# Patient Record
Sex: Female | Born: 1995 | Race: Black or African American | Hispanic: No | Marital: Single | Smoking: Never smoker
Health system: Southern US, Community
[De-identification: ages and names within clinical notes are randomized; demographics above are authoritative.]

## PROBLEM LIST (undated history)

## (undated) DIAGNOSIS — F99 Mental disorder, not otherwise specified: Secondary | ICD-10-CM

## (undated) DIAGNOSIS — J45909 Unspecified asthma, uncomplicated: Secondary | ICD-10-CM

## (undated) HISTORY — PX: TONSILLECTOMY: SUR1361

## (undated) HISTORY — PX: CERVICAL CERCLAGE: SHX1329

## (undated) HISTORY — DX: Mental disorder, not otherwise specified: F99

---

## 2020-02-22 ENCOUNTER — Encounter (HOSPITAL_COMMUNITY): Payer: Self-pay

## 2020-02-22 ENCOUNTER — Other Ambulatory Visit: Payer: Self-pay

## 2020-02-22 ENCOUNTER — Emergency Department (HOSPITAL_COMMUNITY)
Admission: EM | Admit: 2020-02-22 | Discharge: 2020-02-23 | Disposition: A | Discharge status: Home / Self Care | Payer: Medicaid - Out of State | Attending: Emergency Medicine | Admitting: Emergency Medicine

## 2020-02-22 DIAGNOSIS — N939 Abnormal uterine and vaginal bleeding, unspecified: Secondary | ICD-10-CM | POA: Diagnosis not present

## 2020-02-22 DIAGNOSIS — J45909 Unspecified asthma, uncomplicated: Secondary | ICD-10-CM | POA: Diagnosis not present

## 2020-02-22 DIAGNOSIS — R1084 Generalized abdominal pain: Secondary | ICD-10-CM

## 2020-02-22 DIAGNOSIS — N938 Other specified abnormal uterine and vaginal bleeding: Secondary | ICD-10-CM

## 2020-02-22 HISTORY — DX: Unspecified asthma, uncomplicated: J45.909

## 2020-02-22 LAB — URINALYSIS, ROUTINE W REFLEX MICROSCOPIC
Bilirubin Urine: NEGATIVE
Glucose, UA: NEGATIVE mg/dL
Hgb urine dipstick: NEGATIVE
Ketones, ur: NEGATIVE mg/dL
Leukocytes,Ua: NEGATIVE
Nitrite: NEGATIVE
Protein, ur: NEGATIVE mg/dL
Specific Gravity, Urine: 1.028 (ref 1.005–1.030)
pH: 6 (ref 5.0–8.0)

## 2020-02-22 LAB — LIPASE, BLOOD: Lipase: 25 U/L (ref 11–51)

## 2020-02-22 LAB — COMPREHENSIVE METABOLIC PANEL
ALT: 24 U/L (ref 0–44)
AST: 21 U/L (ref 15–41)
Albumin: 4.2 g/dL (ref 3.5–5.0)
Alkaline Phosphatase: 52 U/L (ref 38–126)
Anion gap: 7 (ref 5–15)
BUN: 15 mg/dL (ref 6–20)
CO2: 26 mmol/L (ref 22–32)
Calcium: 9.5 mg/dL (ref 8.9–10.3)
Chloride: 103 mmol/L (ref 98–111)
Creatinine, Ser: 0.96 mg/dL (ref 0.44–1.00)
GFR calc non Af Amer: >60 mL/min (ref >60)
Glucose, Bld: 96 mg/dL (ref 70–99)
Potassium: 3.9 mmol/L (ref 3.5–5.1)
Sodium: 136 mmol/L (ref 135–145)
Total Bilirubin: 0.3 mg/dL (ref 0.3–1.2)
Total Protein: 7.5 g/dL (ref 6.5–8.1)

## 2020-02-22 LAB — CBC
HCT: 39.7 % (ref 36.0–46.0)
Hemoglobin: 13.2 g/dL (ref 12.0–15.0)
MCH: 29.5 pg (ref 26.0–34.0)
MCHC: 33.2 g/dL (ref 30.0–36.0)
MCV: 88.8 fL (ref 80.0–100.0)
Platelets: 234 10*3/uL (ref 150–400)
RBC: 4.47 MIL/uL (ref 3.87–5.11)
RDW: 13.8 % (ref 11.5–15.5)
WBC: 10.7 10*3/uL — ABNORMAL HIGH (ref 4.0–10.5)
nRBC: 0 % (ref 0.0–0.2)

## 2020-02-22 LAB — POC URINE PREG, ED: Preg Test, Ur: NEGATIVE

## 2020-02-22 NOTE — ED Triage Notes (Signed)
Pt to er, pt states that she is here for some pelvic pain and bleeding, states that she hasn't had her menstrual cycle since august, states that she has taken several pregnancy tests, states that some have come back positive and others have come back negative.  Pt states that last month she was dx with two ovarian cysts, pt states that she is here tonight for abd pain and cramping and also som bleeding, states that she has been bleeding off an on for the past month.  States that sometimes it is like a period, other times it is very light.  Pt states that today her bleeding is very light.

## 2020-02-23 ENCOUNTER — Emergency Department (HOSPITAL_COMMUNITY): Payer: Medicaid - Out of State

## 2020-02-23 LAB — WET PREP, GENITAL
Clue Cells Wet Prep HPF POC: NONE SEEN
Sperm: NONE SEEN
Trich, Wet Prep: NONE SEEN
Yeast Wet Prep HPF POC: NONE SEEN

## 2020-02-23 MED ORDER — IBUPROFEN 600 MG PO TABS: 600.0000 mg | ORAL_TABLET | Freq: Four times a day (QID) | ORAL | 0 refills | Status: AC | PRN

## 2020-02-23 MED ORDER — KETOROLAC TROMETHAMINE 30 MG/ML IJ SOLN
30.0000 mg | Freq: Once | INTRAMUSCULAR | Status: AC
  Administered 2020-02-23: 30 mg via INTRAVENOUS
  Filled 2020-02-23: qty 1

## 2020-02-23 MED ORDER — IOHEXOL 300 MG/ML  SOLN
100.0000 mL | Freq: Once | INTRAMUSCULAR | Status: AC | PRN
  Administered 2020-02-23: 100 mL via INTRAVENOUS

## 2020-02-23 MED ORDER — SODIUM CHLORIDE 0.9 % IV BOLUS
1000.0000 mL | Freq: Once | INTRAVENOUS | Status: AC
  Administered 2020-02-23: 1000 mL via INTRAVENOUS

## 2020-02-23 NOTE — ED Provider Notes (Signed)
[facility] EMERGENCY DEPARTMENT Provider Note   CSN: [id number] Arrival date & time: 02/22/20  1927     History Chief Complaint  Patient presents with  . Abdominal Pain    Mercia Dowe is a 24 y.o. female.  Pt presents to the ED today with lower abdominal pain.  She also has not had a period since August.  She has taken 10 pregnancy tests.  The one today was positive, so she came in.  She does have a hx of ovarian cysts and is concerned one has ruptured.  She has an obgyn in [place], but can't get an appointment until January.  No f/c.          Past Medical History:  Diagnosis Date  . Asthma     There are no problems to display for this patient.   Past Surgical History:  Procedure Laterality Date  . TONSILLECTOMY       OB History   No obstetric history on file.     History reviewed. No pertinent family history.  Social History   Tobacco Use  . Smoking status: Never Smoker  . Smokeless tobacco: Never Used  Vaping Use  . Vaping Use: Never used  Substance Use Topics  . Alcohol use: Not Currently  . Drug use: Not Currently    Home Medications Prior to Admission medications   Medication Sig Start Date End Date Taking? Authorizing Provider  ibuprofen (ADVIL) 600 MG tablet Take 1 tablet (600 mg total) by mouth every 6 (six) hours as needed. 02/23/20   Jacalyn Lefevre, MD    Allergies    Patient has no allergy information on record.  Review of Systems   Review of Systems  Gastrointestinal: Positive for abdominal pain.  All other systems reviewed and are negative.   Physical Exam Updated Vital Signs BP 127/87 (BP Location: Right Arm)   Pulse 95   Temp 98.7 F (37.1 C) (Oral)   Resp 18   Ht 5\' 2"  (1.575 m)   Wt 74.8 kg   SpO2 100%   BMI 30.18 kg/m   Physical Exam Vitals and nursing note reviewed. Exam conducted with a chaperone present.  Constitutional:      Appearance: She is well-developed.  HENT:     Head: Normocephalic and  atraumatic.     Mouth/Throat:     Mouth: Mucous membranes are moist.     Pharynx: Oropharynx is clear.  Eyes:     Extraocular Movements: Extraocular movements intact.     Pupils: Pupils are equal, round, and reactive to light.  Cardiovascular:     Rate and Rhythm: Normal rate and regular rhythm.     Heart sounds: Normal heart sounds.  Pulmonary:     Effort: Pulmonary effort is normal.     Breath sounds: Normal breath sounds.  Abdominal:     General: Abdomen is flat. Bowel sounds are normal.     Palpations: Abdomen is soft.     Tenderness: There is abdominal tenderness in the right lower quadrant and left lower quadrant.  Genitourinary:    General: Normal vulva.     Exam position: Lithotomy position.     Vagina: Normal.     Cervix: Normal.     Uterus: Normal.      Adnexa:        Right: Tenderness present.        Left: Tenderness present.   Skin:    General: Skin is warm.     Capillary Refill: Capillary  refill takes less than 2 seconds.  Neurological:     General: No focal deficit present.     Mental Status: She is alert and oriented to person, place, and time.  Psychiatric:        Mood and Affect: Mood normal.        Behavior: Behavior normal.     ED Results / Procedures / Treatments   Labs (all labs ordered are listed, but only abnormal results are displayed) Labs Reviewed  WET PREP, GENITAL - Abnormal; Notable for the following components:      Result Value   WBC, Wet Prep HPF POC MODERATE (*)    All other components within normal limits  CBC - Abnormal; Notable for the following components:   WBC 10.7 (*)    All other components within normal limits  URINALYSIS, ROUTINE W REFLEX MICROSCOPIC - Abnormal; Notable for the following components:   APPearance HAZY (*)    All other components within normal limits  LIPASE, BLOOD  COMPREHENSIVE METABOLIC PANEL  POC URINE PREG, ED  GC/CHLAMYDIA PROBE AMP ([place]) NOT AT [facility]    EKG None  Radiology CT ABDOMEN  PELVIS W CONTRAST  Result Date: 02/23/2020 CLINICAL DATA:  Abdominal pain acute nonlocalized EXAM: CT ABDOMEN AND PELVIS WITH CONTRAST TECHNIQUE: Multidetector CT imaging of the abdomen and pelvis was performed using the standard protocol following bolus administration of intravenous contrast. CONTRAST:  OMNIPAQUE IOHEXOL 300 MG/ML  SOLN COMPARISON:  None. FINDINGS: Lower chest: The visualized heart size within normal limits. No pericardial fluid/thickening. No hiatal hernia. The visualized portions of the lungs are clear. Hepatobiliary: The liver is normal in density without focal abnormality.The main portal vein is patent. No evidence of calcified gallstones, gallbladder wall thickening or biliary dilatation. Pancreas: Unremarkable. No pancreatic ductal dilatation or surrounding inflammatory changes. Spleen: Normal in size without focal abnormality. Adrenals/Urinary Tract: Both adrenal glands appear normal. The kidneys and collecting system appear normal without evidence of urinary tract calculus or hydronephrosis. Bladder is unremarkable. Stomach/Bowel: The stomach, small bowel, and colon are normal in appearance. No inflammatory changes, wall thickening, or obstructive findings.The appendix is normal. Vascular/Lymphatic: There are no enlarged mesenteric, retroperitoneal, or pelvic lymph nodes. No significant vascular findings are present. Reproductive: The uterus and adnexa are unremarkable. A small amount of free fluid is seen within the cul-de-sac. Other: No evidence of abdominal wall mass or hernia. Musculoskeletal: No acute or significant osseous findings. IMPRESSION: No acute intra-abdominal or pelvic pathology to explain the patient's symptoms. Electronically Signed   By: Jonna Clark M.D.   On: 02/23/2020 01:02    Procedures Procedures (including critical care time)  Medications Ordered in ED Medications  ketorolac (TORADOL) 30 MG/ML injection 30 mg (30 mg Intravenous Given 02/23/20 0034)   sodium chloride 0.9 % bolus 1,000 mL (1,000 mLs Intravenous New Bag/Given 02/23/20 0034)  iohexol (OMNIPAQUE) 300 MG/ML solution 100 mL (100 mLs Intravenous Contrast Given 02/23/20 0044)    ED Course  I have reviewed the triage vital signs and the nursing notes.  Pertinent labs & imaging results that were available during my care of the patient were reviewed by me and considered in my medical decision making (see chart for details).    MDM Rules/Calculators/A&P                          Labs unremarkable.  CT nothing acute.  Pt is feeling better.  She is instructed to f/u with  gyn.  Return if worse.  Final Clinical Impression(s) / ED Diagnoses Final diagnoses:  Generalized abdominal pain  DUB (dysfunctional uterine bleeding)    Rx / DC Orders ED Discharge Orders         Ordered    ibuprofen (ADVIL) 600 MG tablet  Every 6 hours PRN        02/23/20 0111           Jacalyn Lefevre, MD 02/23/20 0112

## 2020-02-24 LAB — GC/CHLAMYDIA PROBE AMP (~~LOC~~) NOT AT ARMC
Chlamydia: NEGATIVE
Comment: NEGATIVE
Comment: NORMAL
Neisseria Gonorrhea: NEGATIVE

## 2020-02-28 ENCOUNTER — Encounter: Payer: Self-pay | Admitting: Adult Health

## 2020-02-28 ENCOUNTER — Ambulatory Visit (INDEPENDENT_AMBULATORY_CARE_PROVIDER_SITE_OTHER): Payer: Medicaid - Out of State | Admitting: Adult Health

## 2020-02-28 VITALS — BP 111/79 | HR 92 | Ht 62.0 in | Wt 174.0 lb

## 2020-02-28 DIAGNOSIS — N941 Unspecified dyspareunia: Secondary | ICD-10-CM | POA: Diagnosis not present

## 2020-02-28 DIAGNOSIS — N926 Irregular menstruation, unspecified: Secondary | ICD-10-CM | POA: Insufficient documentation

## 2020-02-28 DIAGNOSIS — R102 Pelvic and perineal pain: Secondary | ICD-10-CM | POA: Insufficient documentation

## 2020-02-28 DIAGNOSIS — Z8742 Personal history of other diseases of the female genital tract: Secondary | ICD-10-CM | POA: Diagnosis not present

## 2020-02-28 LAB — POCT URINE PREGNANCY: Preg Test, Ur: NEGATIVE

## 2020-02-28 NOTE — Progress Notes (Signed)
  Subjective:     Patient ID: Jasmine Bernard, female   DOB: [birth year], 24 y.o.   MRN: [id number]  HPI Jasmine Bernard is a 24 year old black female, single, G1P0101, in for follow up of ER visit 02/23/20 at [facility], had pelvic pain, bleeding on and off. And LMP in August. She says was seen in [place] in May and told had cyst on ovary, but CT done 02/23/20 did not show any. GC/CHL was negative in ER and WBC was 10.7 PCP is Tresa Res FNP  Review of Systems +pelvic pain +pain with sex She denies any problems with BMs or urination  Reviewed past medical,surgical, social and family history. Reviewed medications and allergies.     Objective:   Physical Exam BP 111/79 (BP Location: Left Arm, Patient Position: Sitting, Cuff Size: Normal)   Pulse 92   Ht 5\' 2"  (1.575 m)   Wt 174 lb (78.9 kg)   LMP 01/05/2020 Comment: pt states that recent bleeding wasn't a cycle  BMI 31.83 kg/m UPT is negative  Skin warm and dry.Pelvic: external genitalia is normal in appearance no lesions, vagina: white discharge, no odor,urethra has no lesions or masses noted, cervix:smooth and bulbous, No CMT,uterus: normal size, shape and contour, mildly tender, no masses felt, adnexa: no masses, LLQ tenderness noted. Bladder is non tender and no masses felt. AA is 4  Fall risk is low PHQ 9 score is 14, she declines meds, no SI  Upstream - 02/28/20 1036      Pregnancy Intention Screening   Does the patient want to become pregnant in the next year? No    Does the patient's partner want to become pregnant in the next year? No    Would the patient like to discuss contraceptive options today? No      Contraception Wrap Up   Current Method No Method - Other Reason    End Method No Method - Other Reason    Contraception Counseling Provided No         Examination chaperoned by 04/29/20 LPN    Assessment:     1. Missed periods  2. Pelvic pain Will get [name] 03/06/20 at [facility] at 12:30 pm be there at 12:15 pm  with a full bladder  3. History of ovarian cyst Will get [facility]YN [facility]  4. Dyspareunia in female     Plan:     Will talk when [place] back, may try OCs to cycle and suppress ovaries

## 2020-03-06 ENCOUNTER — Ambulatory Visit (HOSPITAL_COMMUNITY)
Admission: RE | Admit: 2020-03-06 | Discharge: 2020-03-06 | Disposition: A | Discharge status: Home / Self Care | Payer: Medicaid - Out of State | Source: Ambulatory Visit | Attending: Adult Health | Admitting: Adult Health

## 2020-03-06 ENCOUNTER — Other Ambulatory Visit: Payer: Self-pay

## 2020-03-06 ENCOUNTER — Encounter (HOSPITAL_COMMUNITY): Payer: Self-pay

## 2020-03-06 ENCOUNTER — Ambulatory Visit (HOSPITAL_COMMUNITY): Payer: Medicaid - Out of State

## 2020-03-06 DIAGNOSIS — R102 Pelvic and perineal pain: Secondary | ICD-10-CM

## 2020-03-06 DIAGNOSIS — Z8742 Personal history of other diseases of the female genital tract: Secondary | ICD-10-CM

## 2020-10-16 ENCOUNTER — Emergency Department (HOSPITAL_COMMUNITY)
Admission: EM | Admit: 2020-10-16 | Discharge: 2020-10-16 | Disposition: A | Discharge status: Home / Self Care | Payer: Medicaid - Out of State | Attending: Emergency Medicine | Admitting: Emergency Medicine

## 2020-10-16 ENCOUNTER — Encounter (HOSPITAL_COMMUNITY): Payer: Self-pay | Admitting: *Deleted

## 2020-10-16 ENCOUNTER — Other Ambulatory Visit: Payer: Self-pay

## 2020-10-16 ENCOUNTER — Emergency Department (HOSPITAL_COMMUNITY): Payer: Medicaid - Out of State

## 2020-10-16 DIAGNOSIS — R102 Pelvic and perineal pain: Secondary | ICD-10-CM

## 2020-10-16 DIAGNOSIS — J45909 Unspecified asthma, uncomplicated: Secondary | ICD-10-CM | POA: Insufficient documentation

## 2020-10-16 DIAGNOSIS — Z3A01 Less than 8 weeks gestation of pregnancy: Secondary | ICD-10-CM | POA: Diagnosis not present

## 2020-10-16 DIAGNOSIS — E871 Hypo-osmolality and hyponatremia: Secondary | ICD-10-CM | POA: Insufficient documentation

## 2020-10-16 DIAGNOSIS — O99511 Diseases of the respiratory system complicating pregnancy, first trimester: Secondary | ICD-10-CM | POA: Diagnosis not present

## 2020-10-16 DIAGNOSIS — N3001 Acute cystitis with hematuria: Secondary | ICD-10-CM | POA: Diagnosis not present

## 2020-10-16 DIAGNOSIS — D72829 Elevated white blood cell count, unspecified: Secondary | ICD-10-CM | POA: Diagnosis not present

## 2020-10-16 DIAGNOSIS — O2311 Infections of bladder in pregnancy, first trimester: Secondary | ICD-10-CM | POA: Insufficient documentation

## 2020-10-16 LAB — BASIC METABOLIC PANEL
Anion gap: 7 (ref 5–15)
BUN: 10 mg/dL (ref 6–20)
CO2: 26 mmol/L (ref 22–32)
Calcium: 9.7 mg/dL (ref 8.9–10.3)
Chloride: 101 mmol/L (ref 98–111)
Creatinine, Ser: 0.59 mg/dL (ref 0.44–1.00)
GFR, Estimated: >60 mL/min (ref >60)
Glucose, Bld: 90 mg/dL (ref 70–99)
Potassium: 3.7 mmol/L (ref 3.5–5.1)
Sodium: 134 mmol/L — ABNORMAL LOW (ref 135–145)

## 2020-10-16 LAB — CBC WITH DIFFERENTIAL/PLATELET
Abs Immature Granulocytes: 0.08 10*3/uL — ABNORMAL HIGH (ref 0.00–0.07)
Basophils Absolute: 0.1 10*3/uL (ref 0.0–0.1)
Basophils Relative: 0 %
Eosinophils Absolute: 0 10*3/uL (ref 0.0–0.5)
Eosinophils Relative: 0 %
HCT: 44.7 % (ref 36.0–46.0)
Hemoglobin: 14.5 g/dL (ref 12.0–15.0)
Immature Granulocytes: 1 %
Lymphocytes Relative: 25 %
Lymphs Abs: 3.4 10*3/uL (ref 0.7–4.0)
MCH: 28.9 pg (ref 26.0–34.0)
MCHC: 32.4 g/dL (ref 30.0–36.0)
MCV: 89.2 fL (ref 80.0–100.0)
Monocytes Absolute: 0.6 10*3/uL (ref 0.1–1.0)
Monocytes Relative: 5 %
Neutro Abs: 9.6 10*3/uL — ABNORMAL HIGH (ref 1.7–7.7)
Neutrophils Relative %: 69 %
Platelets: 324 10*3/uL (ref 150–400)
RBC: 5.01 MIL/uL (ref 3.87–5.11)
RDW: 14.5 % (ref 11.5–15.5)
WBC: 13.8 10*3/uL — ABNORMAL HIGH (ref 4.0–10.5)
nRBC: 0 % (ref 0.0–0.2)

## 2020-10-16 LAB — URINALYSIS, ROUTINE W REFLEX MICROSCOPIC
Bilirubin Urine: NEGATIVE
Glucose, UA: NEGATIVE mg/dL
Hgb urine dipstick: NEGATIVE
Ketones, ur: NEGATIVE mg/dL
Nitrite: NEGATIVE
Protein, ur: NEGATIVE mg/dL
Specific Gravity, Urine: 1.021 (ref 1.005–1.030)
WBC, UA: >50 WBC/hpf — ABNORMAL HIGH (ref 0–5)
pH: 6 (ref 5.0–8.0)

## 2020-10-16 LAB — WET PREP, GENITAL
Clue Cells Wet Prep HPF POC: NONE SEEN
Sperm: NONE SEEN
Trich, Wet Prep: NONE SEEN
Yeast Wet Prep HPF POC: NONE SEEN

## 2020-10-16 LAB — HCG, SERUM, QUALITATIVE: Preg, Serum: POSITIVE — AB

## 2020-10-16 LAB — HCG, QUANTITATIVE, PREGNANCY: hCG, Beta Chain, Quant, S: 167 m[IU]/mL — ABNORMAL HIGH (ref <5)

## 2020-10-16 MED ORDER — CEPHALEXIN 500 MG PO CAPS: 500.0000 mg | ORAL_CAPSULE | Freq: Two times a day (BID) | ORAL | 0 refills | Status: AC

## 2020-10-16 NOTE — ED Triage Notes (Signed)
Pt states she has been having lots of cramping today; pt states she is [redacted] weeks pregnant and has not been to see her obgyn yet

## 2020-10-16 NOTE — Discharge Instructions (Signed)
Lab work reveals you have a UTI, I have started you on antibiotics please take as prescribed.  Your ultrasound did not show a intrauterine pregnancy, possibilities include pregnancy too early to see, pregnancy in the wrong area, possible miscarriage.    It is extremely important a follow-up with your OB/GYN you must see them within the next 24 to 48 hours.  Please call to schedule an appointment.  Come back to the emergency department if you develop chest pain, shortness of breath, severe abdominal pain, uncontrolled nausea, vomiting, diarrhea.

## 2020-10-16 NOTE — ED Provider Notes (Signed)
[facility] EMERGENCY DEPARTMENT Provider Note   CSN: [id number] Arrival date & time: 10/16/20  1815     History Chief Complaint  Patient presents with  . Abdominal Pain    Cramping; [redacted] weeks pregnant    Jasmine Bernard is a 25 y.o. female.  HPI   Patient with no significant medical history presents to the emergency department with chief complaint of pelvic cramping.  Patient states pelvic cramping started 1 week ago but over the last 3 days it has progressively gotten worse.  She states the cramping is constant, she denies vaginal bleeding, vaginal discharge, any urinary symptoms, she denies abdominal pain, nausea, vomiting or diarrhea.  She denies systemic infection fevers or chills.  Patient states that she believes she is [redacted] weeks pregnant, states that she had a slightly positive pregnancy test 1 week ago and then had positive presents over the last few days.  Patient does not use contraceptives, last menstrual cycle was May 15, states she is generally irregular.  She has not yet seen a OB/GYN, she denies history of ovarian torsion's, ectopic pregnancies, she does admit to ovarian cysts.  She denies any alleviating factors.  Patient denies headaches, fevers, chills, shortness of breath, chest pain, abdominal pain., nausea, vomiting, diarrhea.  Past Medical History:  Diagnosis Date  . Asthma   . Mental disorder     Patient Active Problem List   Diagnosis Date Noted  . History of ovarian cyst 02/28/2020  . Pelvic pain 02/28/2020  . Missed periods 02/28/2020  . Dyspareunia in female 02/28/2020    Past Surgical History:  Procedure Laterality Date  . CERVICAL CERCLAGE    . TONSILLECTOMY       OB History    Gravida  2   Para  1   Term  0   Preterm  1   AB  0   Living  1     SAB      IAB      Ectopic      Multiple      Live Births              Family History  Problem Relation Age of Onset  . Hypertension Paternal Grandfather   . Diabetes Paternal  Grandfather   . Hypertension Paternal Grandmother   . Diabetes Paternal Grandmother   . Hypertension Maternal Grandmother   . Hypertension Maternal Grandfather   . Mental illness Father   . Hypertension Mother   . Diabetes Mother     Social History   Tobacco Use  . Smoking status: Never Smoker  . Smokeless tobacco: Never Used  Vaping Use  . Vaping Use: Never used  Substance Use Topics  . Alcohol use: Yes  . Drug use: Not Currently    Home Medications Prior to Admission medications   Medication Sig Start Date End Date Taking? Authorizing Provider  cephALEXin (KEFLEX) 500 MG capsule Take 1 capsule (500 mg total) by mouth 2 (two) times daily for 7 days. 10/16/20 10/23/20 Yes Carroll Sage, PA-C  clonazePAM (KLONOPIN) 0.5 MG tablet Take 0.25-0.5 mg by mouth daily. 02/02/20   [provider]  ibuprofen (ADVIL) 600 MG tablet Take 1 tablet (600 mg total) by mouth every 6 (six) hours as needed. 02/23/20   Jacalyn Lefevre, MD    Allergies    Patient has no known allergies.  Review of Systems   Review of Systems  Constitutional: Negative for chills and fever.  HENT: Negative for congestion.  Respiratory: Negative for shortness of breath.   Cardiovascular: Negative for chest pain.  Gastrointestinal: Negative for abdominal pain, diarrhea, nausea and vomiting.  Genitourinary: Positive for pelvic pain. Negative for decreased urine volume, dyspareunia, dysuria, enuresis, frequency, hematuria, menstrual problem, vaginal bleeding and vaginal discharge.  Musculoskeletal: Negative for back pain.  Skin: Negative for rash.  Neurological: Negative for dizziness.  Hematological: Does not bruise/bleed easily.    Physical Exam Updated Vital Signs BP 123/84   Pulse 68   Temp 98.6 F (37 C) (Oral)   Resp 18   Ht 5\' 2"  (1.575 m)   Wt 79.4 kg   LMP 09/20/2020   SpO2 100%   BMI 32.01 kg/m   Physical Exam Vitals and nursing note reviewed. Exam conducted with a chaperone  present.  Constitutional:      General: She is not in acute distress.    Appearance: Normal appearance. She is not ill-appearing.  HENT:     Head: Normocephalic and atraumatic.     Nose: No congestion or rhinorrhea.  Eyes:     Conjunctiva/sclera: Conjunctivae normal.  Cardiovascular:     Rate and Rhythm: Normal rate and regular rhythm.     Pulses: Normal pulses.     Heart sounds: No murmur heard. No friction rub. No gallop.   Pulmonary:     Effort: No respiratory distress.     Breath sounds: No stridor. No wheezing, rhonchi or rales.  Abdominal:     General: There is no distension.     Palpations: Abdomen is soft.     Tenderness: There is abdominal tenderness. There is no right CVA tenderness, left CVA tenderness or guarding.     Comments: Patient's abdomen was visualized nondistended, normative bowel sounds, dull to percussion.  She does have  tenderness in the lower pelvic region, no guarding, peritoneal sign or rebound tenderness present.  Genitourinary:    Comments: Pelvic exam performed, exterior genitalia was examined there was no lesions, rashes or discharge noted.  Vaginal canal was patent, pink, there was physiological discharge present in the vaginal canal,  Cervix was visualized there was no lesions.  Patient has noted adnexal pain on the right side, no chandelier sign Skin:    General: Skin is warm and dry.  Neurological:     Mental Status: She is alert.  Psychiatric:        Mood and Affect: Mood normal.     ED Results / Procedures / Treatments   Labs (all labs ordered are listed, but only abnormal results are displayed) Labs Reviewed  WET PREP, GENITAL - Abnormal; Notable for the following components:      Result Value   WBC, Wet Prep HPF POC MODERATE (*)    All other components within normal limits  BASIC METABOLIC PANEL - Abnormal; Notable for the following components:   Sodium 134 (*)    All other components within normal limits  CBC WITH  DIFFERENTIAL/PLATELET - Abnormal; Notable for the following components:   WBC 13.8 (*)    Neutro Abs 9.6 (*)    Abs Immature Granulocytes 0.08 (*)    All other components within normal limits  HCG, SERUM, QUALITATIVE - Abnormal; Notable for the following components:   Preg, Serum POSITIVE (*)    All other components within normal limits  URINALYSIS, ROUTINE W REFLEX MICROSCOPIC - Abnormal; Notable for the following components:   APPearance CLOUDY (*)    Leukocytes,Ua LARGE (*)    WBC, UA >50 (*)  Bacteria, UA MANY (*)    All other components within normal limits  HCG, QUANTITATIVE, PREGNANCY - Abnormal; Notable for the following components:   hCG, Beta Chain, Quant, S 167 (*)    All other components within normal limits  URINE CULTURE  GC/CHLAMYDIA PROBE AMP ([place]) NOT AT [facility]    EKG None  Radiology US OB LESS THAN 14 WEEKS WITH OB TRANSVAGINAL  Result Date: 10/16/2020 CLINICAL DATA:  Left lower quadrant pain EXAM: OBSTETRIC <14 WK US AND TRANSVAGINAL OB US TECHNIQUE: Both transabdominal and transvaginal ultrasound examinations were performed for complete evaluation of the gestation as well as the maternal uterus, adnexal regions, and pelvic cul-de-sac. Transvaginal technique was performed to assess early pregnancy. COMPARISON:  None. FINDINGS: Intrauterine gestational sac: None Yolk sac:  Not Visualized. Embryo:  Not Visualized. Cardiac Activity: Not Visualized. Heart Rate:   bpm MSD:   mm    w     d CRL:    mm    w    d                  US EDC: Subchorionic hemorrhage:  None visualized. Maternal uterus/adnexae: Heterogeneous echotexture throughout the uterus. Endometrium approximately 18 mm in thickness, difficult to visualize due to the heterogeneous echotexture throughout the uterus. No adnexal mass. Moderate free fluid in the pelvis. IMPRESSION: No intrauterine pregnancy visualized. Differential considerations would include early intrauterine pregnancy too early to visualize,  spontaneous abortion, or occult ectopic pregnancy. Recommend close clinical followup and serial quantitative beta HCGs and ultrasounds. Moderate free fluid. Electronically Signed   By: Charlett NoseKevin  Dover M.D.   On: 10/16/2020 21:41    Procedures Pelvic exam  Date/Time: 10/16/2020 9:37 PM Performed by: Carroll SageFaulkner, Charlesia Canaday J, PA-C Authorized by: Carroll SageFaulkner, Rethel Sebek J, PA-C  Consent: Verbal consent obtained. Risks and benefits: risks, benefits and alternatives were discussed Consent given by: patient Patient identity confirmed: verbally with patient Preparation: Patient was prepped and draped in the usual sterile fashion. Local anesthesia used: no  Anesthesia: Local anesthesia used: no  Sedation: Patient sedated: no  Patient tolerance: patient tolerated the procedure well with no immediate complications      Medications Ordered in ED Medications - No data to display  ED Course  I have reviewed the triage vital signs and the nursing notes.  Pertinent labs & imaging results that were available during my care of the patient were reviewed by me and considered in my medical decision making (see chart for details).    MDM Rules/Calculators/A&P                         Initial impression-patient presents with abdominal cramping.  She is alert, does not appear to be in acute distress, vital signs reassuring.  Concern for ectopic pregnancy versus ovarian torsion versus UTI.  Will obtain basic lab work-up, recommend pelvic exam and reassess.  Work-up-CBC shows leukocytosis of 13.8, BMP shows hyponatremia 134, UA shows large leukocytes, 6-10 red blood cells, many white blood cells, many bacteria, 11-12 squamous cells.  Wet prep shows moderate white blood cells.  hCG quantitative 167, transvaginal ultrasound reveals no intrauterine pregnancy differential diagnosis includes early intrauterine pregnancy, spontaneous abortion, ectopic pregnancy.  Reassessment patient is agreeable to pelvic exam, she has  noted right-sided adnexal pain.  Concerning for torsion versus ectopic pregnancy.  Will order transvaginal sound for further evaluation.  Patient was updated on lab work and imaging, patient has no complaints at this  time, vital signs have remained stable.  Patient is agreeable for discharge at this time.  Rule out-low suspicion for spontaneous abortion as patient has no vaginal bleeding or vaginal discharge present on my exam.  Low suspicion for ectopic pregnancy as presentation is inconsistent with etiology patient is not in severe pain, there is no acute findings seen on ultrasound.  Patient does have pelvic cramping but I suspect this is secondary due to UTI and/or early pregnancy.  Low suspicion for STI as patient denies vaginal discharge, there is no noted discharge present on my exam, no chandelier sign.  Low suspicion for pyelonephritis as vital signs reassuring, patient is nontoxic-appearing, patient has no CVA tenderness present my exam.  Low suspicion for appendicitis as patient has no left lower quadrant tenderness.  Plan-  1.  Pelvic cramping-suspect this is secondary due to UTI and early pregnancy.  But cannot exclude possibility of ectopic pregnancy and/or spontaneous miscarriage.  We will start patient on antibiotics, have her follow-up with OB/GYN next 24 to 48 hours for repeat hCG.  Vital signs have remained stable, no indication for hospital admission.  Patient discussed with attending and they agreed with assessment and plan.  Patient given at home care as well strict return precautions.  Patient verbalized that they understood agreed to said plan.   Final Clinical Impression(s) / ED Diagnoses Final diagnoses:  Pelvic cramping  Acute cystitis with hematuria    Rx / DC Orders ED Discharge Orders         Ordered    cephALEXin (KEFLEX) 500 MG capsule  2 times daily        10/16/20 2227    Ambulatory referral to Obstetrics / Gynecology        10/16/20 2230            Carroll Sage, PA-C 10/16/20 2309    Terrilee Files, MD 10/17/20 1017

## 2020-10-18 LAB — GC/CHLAMYDIA PROBE AMP (~~LOC~~) NOT AT ARMC
Chlamydia: NEGATIVE
Comment: NEGATIVE
Comment: NORMAL
Neisseria Gonorrhea: POSITIVE — AB

## 2020-10-18 LAB — URINE CULTURE

## 2020-10-19 ENCOUNTER — Encounter (HOSPITAL_COMMUNITY): Payer: Self-pay | Admitting: Emergency Medicine

## 2020-10-19 ENCOUNTER — Emergency Department (HOSPITAL_COMMUNITY)
Admission: EM | Admit: 2020-10-19 | Discharge: 2020-10-20 | Disposition: A | Discharge status: Home / Self Care | Payer: Medicaid - Out of State | Attending: Emergency Medicine | Admitting: Emergency Medicine

## 2020-10-19 ENCOUNTER — Other Ambulatory Visit: Payer: Self-pay

## 2020-10-19 DIAGNOSIS — A549 Gonococcal infection, unspecified: Secondary | ICD-10-CM | POA: Insufficient documentation

## 2020-10-19 DIAGNOSIS — O26891 Other specified pregnancy related conditions, first trimester: Secondary | ICD-10-CM

## 2020-10-19 DIAGNOSIS — J45909 Unspecified asthma, uncomplicated: Secondary | ICD-10-CM | POA: Insufficient documentation

## 2020-10-19 DIAGNOSIS — O2310 Infections of bladder in pregnancy, unspecified trimester: Secondary | ICD-10-CM | POA: Diagnosis not present

## 2020-10-19 DIAGNOSIS — N3000 Acute cystitis without hematuria: Secondary | ICD-10-CM | POA: Diagnosis not present

## 2020-10-19 DIAGNOSIS — R109 Unspecified abdominal pain: Secondary | ICD-10-CM | POA: Diagnosis not present

## 2020-10-19 DIAGNOSIS — Z3A00 Weeks of gestation of pregnancy not specified: Secondary | ICD-10-CM | POA: Insufficient documentation

## 2020-10-19 LAB — URINALYSIS, ROUTINE W REFLEX MICROSCOPIC
Bilirubin Urine: NEGATIVE
Glucose, UA: 50 mg/dL — AB
Hgb urine dipstick: NEGATIVE
Ketones, ur: NEGATIVE mg/dL
Nitrite: NEGATIVE
Protein, ur: NEGATIVE mg/dL
Specific Gravity, Urine: 1.027 (ref 1.005–1.030)
WBC, UA: >50 WBC/hpf — ABNORMAL HIGH (ref 0–5)
pH: 6 (ref 5.0–8.0)

## 2020-10-19 LAB — COMPREHENSIVE METABOLIC PANEL
ALT: 51 U/L — ABNORMAL HIGH (ref 0–44)
AST: 17 U/L (ref 15–41)
Albumin: 4 g/dL (ref 3.5–5.0)
Alkaline Phosphatase: 50 U/L (ref 38–126)
Anion gap: 5 (ref 5–15)
BUN: 10 mg/dL (ref 6–20)
CO2: 26 mmol/L (ref 22–32)
Calcium: 9.1 mg/dL (ref 8.9–10.3)
Chloride: 105 mmol/L (ref 98–111)
Creatinine, Ser: 0.56 mg/dL (ref 0.44–1.00)
GFR, Estimated: >60 mL/min (ref >60)
Glucose, Bld: 81 mg/dL (ref 70–99)
Potassium: 3.9 mmol/L (ref 3.5–5.1)
Sodium: 136 mmol/L (ref 135–145)
Total Bilirubin: 0.5 mg/dL (ref 0.3–1.2)
Total Protein: 7.2 g/dL (ref 6.5–8.1)

## 2020-10-19 LAB — CBC
HCT: 40 % (ref 36.0–46.0)
Hemoglobin: 12.8 g/dL (ref 12.0–15.0)
MCH: 29.2 pg (ref 26.0–34.0)
MCHC: 32 g/dL (ref 30.0–36.0)
MCV: 91.1 fL (ref 80.0–100.0)
Platelets: 317 10*3/uL (ref 150–400)
RBC: 4.39 MIL/uL (ref 3.87–5.11)
RDW: 14.4 % (ref 11.5–15.5)
WBC: 14.4 10*3/uL — ABNORMAL HIGH (ref 4.0–10.5)
nRBC: 0 % (ref 0.0–0.2)

## 2020-10-19 LAB — LIPASE, BLOOD: Lipase: 25 U/L (ref 11–51)

## 2020-10-19 LAB — POC URINE PREG, ED: Preg Test, Ur: POSITIVE — AB

## 2020-10-19 NOTE — ED Triage Notes (Signed)
Pt states that she is still having lower abdominal cramping. Has been seen here for the same this week. Pt estimates that she is two weeks pregnant. Pt states someone called her and told her to "come to the ED for a shot"

## 2020-10-20 MED ORDER — CEFTRIAXONE SODIUM 500 MG IJ SOLR
500.0000 mg | Freq: Once | INTRAMUSCULAR | Status: AC
  Administered 2020-10-20: 500 mg via INTRAMUSCULAR
  Filled 2020-10-20: qty 500

## 2020-10-20 NOTE — ED Provider Notes (Signed)
[facility] EMERGENCY DEPARTMENT Provider Note   CSN: [id number] Arrival date & time: 10/19/20  1919     History Chief Complaint  Patient presents with  . Abdominal Pain    Jasmine Bernard is a 25 y.o. female.  HPI Patient states she has same symptoms as the other day but actually slightly improved.  She is already been following up with her obstetrician in reference to her pregnancy.  She is been taking antibiotics for urinary tract infection.  She stated somebody called on the phone today is that she had gonorrhea and to come here to get a shot.  On review the records it does appear that her gonorrhea test was positive.  She is on a cephalosporin for UTI however did not receive a IM shot on her previous visit.  No vaginal discharge.  Her abdomen is not really having pain.    Past Medical History:  Diagnosis Date  . Asthma   . Mental disorder     Patient Active Problem List   Diagnosis Date Noted  . History of ovarian cyst 02/28/2020  . Pelvic pain 02/28/2020  . Missed periods 02/28/2020  . Dyspareunia in female 02/28/2020    Past Surgical History:  Procedure Laterality Date  . CERVICAL CERCLAGE    . TONSILLECTOMY       OB History    Gravida  2   Para  1   Term  0   Preterm  1   AB  0   Living  1     SAB      IAB      Ectopic      Multiple      Live Births              Family History  Problem Relation Age of Onset  . Hypertension Paternal Grandfather   . Diabetes Paternal Grandfather   . Hypertension Paternal Grandmother   . Diabetes Paternal Grandmother   . Hypertension Maternal Grandmother   . Hypertension Maternal Grandfather   . Mental illness Father   . Hypertension Mother   . Diabetes Mother     Social History   Tobacco Use  . Smoking status: Never Smoker  . Smokeless tobacco: Never Used  Vaping Use  . Vaping Use: Never used  Substance Use Topics  . Alcohol use: Yes  . Drug use: Not Currently    Home Medications Prior  to Admission medications   Medication Sig Start Date End Date Taking? Authorizing Provider  cephALEXin (KEFLEX) 500 MG capsule Take 1 capsule (500 mg total) by mouth 2 (two) times daily for 7 days. 10/16/20 10/23/20  Carroll Sage, PA-C  clonazePAM (KLONOPIN) 0.5 MG tablet Take 0.25-0.5 mg by mouth daily. 02/02/20   [provider]  ibuprofen (ADVIL) 600 MG tablet Take 1 tablet (600 mg total) by mouth every 6 (six) hours as needed. 02/23/20   Jacalyn Lefevre, MD    Allergies    Patient has no known allergies.  Review of Systems   Review of Systems  All other systems reviewed and are negative.   Physical Exam Updated Vital Signs BP 122/90   Pulse 90   Temp 98.6 F (37 C) (Oral)   Resp 16   Ht 5\' 2"  (1.575 m)   Wt 79.4 kg   LMP 09/20/2020   SpO2 100%   BMI 32.02 kg/m   Physical Exam Vitals and nursing note reviewed.  Constitutional:      Appearance: She is  well-developed.  HENT:     Head: Normocephalic and atraumatic.     Nose: Nose normal. No congestion or rhinorrhea.     Mouth/Throat:     Mouth: Mucous membranes are moist.     Pharynx: Oropharynx is clear.  Eyes:     Pupils: Pupils are equal, round, and reactive to light.  Cardiovascular:     Rate and Rhythm: Normal rate and regular rhythm.  Pulmonary:     Effort: No respiratory distress.     Breath sounds: No stridor.  Abdominal:     General: Abdomen is flat. There is no distension.  Musculoskeletal:        General: No swelling or tenderness. Normal range of motion.     Cervical back: Normal range of motion.  Skin:    General: Skin is warm and dry.  Neurological:     General: No focal deficit present.     Mental Status: She is alert.     ED Results / Procedures / Treatments   Labs (all labs ordered are listed, but only abnormal results are displayed) Labs Reviewed  COMPREHENSIVE METABOLIC PANEL - Abnormal; Notable for the following components:      Result Value   ALT 51 (*)    All other  components within normal limits  CBC - Abnormal; Notable for the following components:   WBC 14.4 (*)    All other components within normal limits  URINALYSIS, ROUTINE W REFLEX MICROSCOPIC - Abnormal; Notable for the following components:   APPearance HAZY (*)    Glucose, UA 50 (*)    Leukocytes,Ua LARGE (*)    WBC, UA >50 (*)    Bacteria, UA RARE (*)    All other components within normal limits  POC URINE PREG, ED - Abnormal; Notable for the following components:   Preg Test, Ur POSITIVE (*)    All other components within normal limits  LIPASE, BLOOD    EKG None  Radiology No results found.  Procedures Procedures   Medications Ordered in ED Medications  cefTRIAXone (ROCEPHIN) injection 500 mg (500 mg Intramuscular Given 10/20/20 0025)    ED Course  I have reviewed the triage vital signs and the nursing notes.  Pertinent labs & imaging results that were available during my care of the patient were reviewed by me and considered in my medical decision making (see chart for details).    MDM Rules/Calculators/A&P                          On a cephalosporin but will give the IM shot as well.  We will follow-up with OB as directed.  Final Clinical Impression(s) / ED Diagnoses Final diagnoses:  Gonorrhea  Acute cystitis without hematuria  Abdominal pain during pregnancy, first trimester    Rx / DC Orders ED Discharge Orders    None       Samaiya Awadallah, Barbara Cower, MD 10/20/20 [phone]

## 2021-01-31 ENCOUNTER — Encounter (HOSPITAL_COMMUNITY): Payer: Self-pay

## 2021-01-31 ENCOUNTER — Other Ambulatory Visit: Payer: Self-pay

## 2021-01-31 ENCOUNTER — Emergency Department (HOSPITAL_COMMUNITY)
Admission: EM | Admit: 2021-01-31 | Discharge: 2021-02-01 | Disposition: A | Discharge status: Home / Self Care | Payer: Medicaid - Out of State | Attending: Emergency Medicine | Admitting: Emergency Medicine

## 2021-01-31 DIAGNOSIS — R0602 Shortness of breath: Secondary | ICD-10-CM | POA: Diagnosis not present

## 2021-01-31 DIAGNOSIS — R300 Dysuria: Secondary | ICD-10-CM | POA: Diagnosis not present

## 2021-01-31 DIAGNOSIS — R1030 Lower abdominal pain, unspecified: Secondary | ICD-10-CM | POA: Insufficient documentation

## 2021-01-31 DIAGNOSIS — R110 Nausea: Secondary | ICD-10-CM | POA: Diagnosis not present

## 2021-01-31 DIAGNOSIS — J45909 Unspecified asthma, uncomplicated: Secondary | ICD-10-CM | POA: Insufficient documentation

## 2021-01-31 DIAGNOSIS — R21 Rash and other nonspecific skin eruption: Secondary | ICD-10-CM | POA: Insufficient documentation

## 2021-01-31 DIAGNOSIS — R109 Unspecified abdominal pain: Secondary | ICD-10-CM | POA: Diagnosis present

## 2021-01-31 LAB — CBC
HCT: 41.5 % (ref 36.0–46.0)
Hemoglobin: 13.4 g/dL (ref 12.0–15.0)
MCH: 28.5 pg (ref 26.0–34.0)
MCHC: 32.3 g/dL (ref 30.0–36.0)
MCV: 88.3 fL (ref 80.0–100.0)
Platelets: 376 10*3/uL (ref 150–400)
RBC: 4.7 MIL/uL (ref 3.87–5.11)
RDW: 13.2 % (ref 11.5–15.5)
WBC: 9.4 10*3/uL (ref 4.0–10.5)
nRBC: 0 % (ref 0.0–0.2)

## 2021-01-31 LAB — URINALYSIS, ROUTINE W REFLEX MICROSCOPIC
Bilirubin Urine: NEGATIVE
Glucose, UA: NEGATIVE mg/dL
Hgb urine dipstick: NEGATIVE
Ketones, ur: NEGATIVE mg/dL
Nitrite: NEGATIVE
Protein, ur: NEGATIVE mg/dL
Specific Gravity, Urine: 1.019 (ref 1.005–1.030)
WBC, UA: >50 WBC/hpf — ABNORMAL HIGH (ref 0–5)
pH: 6 (ref 5.0–8.0)

## 2021-01-31 LAB — COMPREHENSIVE METABOLIC PANEL
ALT: 26 U/L (ref 0–44)
AST: 15 U/L (ref 15–41)
Albumin: 4.1 g/dL (ref 3.5–5.0)
Alkaline Phosphatase: 62 U/L (ref 38–126)
Anion gap: 7 (ref 5–15)
BUN: 10 mg/dL (ref 6–20)
CO2: 27 mmol/L (ref 22–32)
Calcium: 9.1 mg/dL (ref 8.9–10.3)
Chloride: 106 mmol/L (ref 98–111)
Creatinine, Ser: 0.62 mg/dL (ref 0.44–1.00)
GFR, Estimated: >60 mL/min (ref >60)
Glucose, Bld: 92 mg/dL (ref 70–99)
Potassium: 3.8 mmol/L (ref 3.5–5.1)
Sodium: 140 mmol/L (ref 135–145)
Total Bilirubin: 0.2 mg/dL — ABNORMAL LOW (ref 0.3–1.2)
Total Protein: 7.6 g/dL (ref 6.5–8.1)

## 2021-01-31 LAB — LIPASE, BLOOD: Lipase: 24 U/L (ref 11–51)

## 2021-01-31 LAB — POC URINE PREG, ED: Preg Test, Ur: NEGATIVE

## 2021-01-31 NOTE — ED Triage Notes (Signed)
Pt reports abd pain that started today, also says she has had shortness of breath, rash, and chills. Pt is afebrile, no shortness of breath observed, no rash observed in triage. Pt O2 is 98% on room air, can speak full sentences, respirations are WNL.

## 2021-02-01 LAB — HIV ANTIBODY (ROUTINE TESTING W REFLEX): HIV Screen 4th Generation wRfx: NONREACTIVE

## 2021-02-01 MED ORDER — CEFTRIAXONE SODIUM 500 MG IJ SOLR
500.0000 mg | Freq: Once | INTRAMUSCULAR | Status: AC
  Administered 2021-02-01: 500 mg via INTRAMUSCULAR
  Filled 2021-02-01: qty 500

## 2021-02-01 MED ORDER — LIDOCAINE HCL (PF) 1 % IJ SOLN
1.0000 mL | Freq: Once | INTRAMUSCULAR | Status: AC
  Administered 2021-02-01: 1 mL
  Filled 2021-02-01: qty 30

## 2021-02-01 MED ORDER — DOXYCYCLINE HYCLATE 100 MG PO CAPS: 100.0000 mg | ORAL_CAPSULE | Freq: Two times a day (BID) | ORAL | 0 refills | Status: AC

## 2021-02-01 MED ORDER — DOXYCYCLINE HYCLATE 100 MG PO TABS
100.0000 mg | ORAL_TABLET | Freq: Once | ORAL | Status: AC
  Administered 2021-02-01: 100 mg via ORAL
  Filled 2021-02-01: qty 1

## 2021-02-01 MED ORDER — CEPHALEXIN 500 MG PO CAPS: 500.0000 mg | ORAL_CAPSULE | Freq: Four times a day (QID) | ORAL | 0 refills | Status: AC

## 2021-02-01 NOTE — Discharge Instructions (Signed)
Take the doxycycline as directed.  Take the Keflex as directed.  Follow-up on MyChart your urine culture results and your STD check.  Get follow-up if not improving over the next 2 to 3 days.  Return for any new or worse symptoms.

## 2021-02-01 NOTE — ED Provider Notes (Signed)
[facility] EMERGENCY DEPARTMENT Provider Note   CSN: [id number] Arrival date & time: 01/31/21  1854     History Chief Complaint  Patient presents with   Abdominal Pain    Chills, shortness of breath, rash    Jasmine Bernard is a 25 y.o. female.  Patient with a faint papular type rash arms back and thigh part of the lower extremities.  Is been present for about a week.  Patient with intermittent abdominal pain bilateral lower quadrants today.  Associated with some nausea.  And patient states that it does not hurt to pee but if she holds her urine it hurts.  She is concerned about having urinary tract infection but does not know the cause of the rash.  Patient has had STDs in the past.  Has 1 partner.  Patient is on birth control.  Patient states she has had chills but no fevers.  No upper respiratory symptoms.  Patient denies any vaginal discharge.      Past Medical History:  Diagnosis Date   Asthma    Mental disorder     Patient Active Problem List   Diagnosis Date Noted   History of ovarian cyst 02/28/2020   Pelvic pain 02/28/2020   Missed periods 02/28/2020   Dyspareunia in female 02/28/2020    Past Surgical History:  Procedure Laterality Date   CERVICAL CERCLAGE     TONSILLECTOMY       OB History     Gravida  2   Para  1   Term  0   Preterm  1   AB  0   Living  1      SAB      IAB      Ectopic      Multiple      Live Births              Family History  Problem Relation Age of Onset   Hypertension Paternal Grandfather    Diabetes Paternal Grandfather    Hypertension Paternal Grandmother    Diabetes Paternal Grandmother    Hypertension Maternal Grandmother    Hypertension Maternal Grandfather    Mental illness Father    Hypertension Mother    Diabetes Mother     Social History   Tobacco Use   Smoking status: Never   Smokeless tobacco: Never  Vaping Use   Vaping Use: Never used  Substance Use Topics   Alcohol use: Yes    Drug use: Not Currently    Home Medications Prior to Admission medications   Medication Sig Start Date End Date Taking? Authorizing Provider  cephALEXin (KEFLEX) 500 MG capsule Take 1 capsule (500 mg total) by mouth 4 (four) times daily. 02/01/21  Yes Vanetta Mulders, MD  doxycycline (VIBRAMYCIN) 100 MG capsule Take 1 capsule (100 mg total) by mouth 2 (two) times daily for 10 days. 02/01/21 02/11/21 Yes Vanetta Mulders, MD  clonazePAM (KLONOPIN) 0.5 MG tablet Take 0.25-0.5 mg by mouth daily. 02/02/20   [provider]  ibuprofen (ADVIL) 600 MG tablet Take 1 tablet (600 mg total) by mouth every 6 (six) hours as needed. 02/23/20   Jacalyn Lefevre, MD    Allergies    Patient has no known allergies.  Review of Systems   Review of Systems  Constitutional:  Positive for chills. Negative for fever.  HENT:  Negative for ear pain and sore throat.   Eyes:  Negative for pain and visual disturbance.  Respiratory:  Negative for cough and  shortness of breath.   Cardiovascular:  Negative for chest pain and palpitations.  Gastrointestinal:  Positive for abdominal pain. Negative for vomiting.  Genitourinary:  Positive for dysuria. Negative for hematuria.  Musculoskeletal:  Negative for arthralgias and back pain.  Skin:  Positive for rash. Negative for color change.  Neurological:  Negative for seizures and syncope.  All other systems reviewed and are negative.  Physical Exam Updated Vital Signs BP (!) 122/96   Pulse 72   Temp 98.3 F (36.8 C) (Oral)   Resp 18   Ht 1.575 m (5\' 2" )   Wt 78.2 kg   LMP 09/20/2020   SpO2 100%   BMI 31.55 kg/m   Physical Exam Vitals and nursing note reviewed.  Constitutional:      General: She is not in acute distress.    Appearance: Normal appearance. She is well-developed.  HENT:     Head: Normocephalic and atraumatic.  Eyes:     Extraocular Movements: Extraocular movements intact.     Conjunctiva/sclera: Conjunctivae normal.     Pupils: Pupils  are equal, round, and reactive to light.  Cardiovascular:     Rate and Rhythm: Normal rate and regular rhythm.     Heart sounds: No murmur heard. Pulmonary:     Effort: Pulmonary effort is normal. No respiratory distress.     Breath sounds: Normal breath sounds.  Abdominal:     General: There is no distension.     Palpations: Abdomen is soft.     Tenderness: There is no abdominal tenderness. There is no guarding.     Comments: Abdomen soft nontender.  In particular no tenderness in the lower quadrant at all.  Musculoskeletal:        General: No swelling. Normal range of motion.     Cervical back: Neck supple.  Skin:    General: Skin is warm and dry.     Capillary Refill: Capillary refill takes less than 2 seconds.     Findings: Rash present.     Comments: Or a fine papular slightly erythematous rash upper extremities back and upper thigh area.  No vesicles.  Does not involve the soles of the feet or the palm of the hands.  Neurological:     General: No focal deficit present.     Mental Status: She is alert and oriented to person, place, and time.    ED Results / Procedures / Treatments   Labs (all labs ordered are listed, but only abnormal results are displayed) Labs Reviewed  COMPREHENSIVE METABOLIC PANEL - Abnormal; Notable for the following components:      Result Value   Total Bilirubin 0.2 (*)    All other components within normal limits  URINALYSIS, ROUTINE W REFLEX MICROSCOPIC - Abnormal; Notable for the following components:   APPearance CLOUDY (*)    Leukocytes,Ua LARGE (*)    WBC, UA >50 (*)    Bacteria, UA RARE (*)    All other components within normal limits  URINE CULTURE  LIPASE, BLOOD  CBC  RPR  HIV ANTIBODY (ROUTINE TESTING W REFLEX)  POC URINE PREG, ED  GC/CHLAMYDIA PROBE AMP ([place]) NOT AT [facility]    EKG None  Radiology No results found.  Procedures Procedures   Medications Ordered in ED Medications  cefTRIAXone (ROCEPHIN) injection 500  mg (has no administration in time range)  lidocaine (PF) (XYLOCAINE) 1 % injection 1 mL (has no administration in time range)  doxycycline (VIBRA-TABS) tablet 100 mg (has no administration  in time range)    ED Course  I have reviewed the triage vital signs and the nursing notes.  Pertinent labs & imaging results that were available during my care of the patient were reviewed by me and considered in my medical decision making (see chart for details).    MDM Rules/Calculators/A&P                           Patient's pregnancy test negative.  Urinalysis cloudy had that white blood cells.  Did not have a lot of bacteria.  Patient does not want pelvic exam did send urine off for STD checking.  Have ordered HIV and RPR.  We will treat patient clinically.  For possible STD per tickly with the rash.  Patient will receive Rocephin here first dose doxycycline.  We will continue doxycycline as an outpatient.  In case there is early PID.  Patient also may have urinary tract infection.  So we will treat outpatient for urinary tract infection with Keflex.  Patient will follow up or return for any new or worse symptoms or if does not improve over the next few days.  Rash may be related or unrelated. Final Clinical Impression(s) / ED Diagnoses Final diagnoses:  Lower abdominal pain  Rash and nonspecific skin eruption  Dysuria    Rx / DC Orders ED Discharge Orders          Ordered    doxycycline (VIBRAMYCIN) 100 MG capsule  2 times daily        02/01/21 0044    cephALEXin (KEFLEX) 500 MG capsule  4 times daily        02/01/21 0044             Vanetta Mulders, MD 02/01/21 [phone]

## 2021-02-02 LAB — URINE CULTURE

## 2021-02-02 LAB — RPR: RPR Ser Ql: NONREACTIVE

## 2021-09-17 IMAGING — US US OB < 14 WEEKS - US OB TV
1 series · 13 of 28 positions shown · non-contrast
Comparison: None.

CLINICAL DATA: Left lower quadrant pain

EXAM:
OBSTETRIC <14 WK US AND TRANSVAGINAL OB US
TECHNIQUE: Both transabdominal and transvaginal ultrasound examinations were
performed for complete evaluation of the gestation as well as the
maternal uterus, adnexal regions, and pelvic cul-de-sac.
Transvaginal technique was performed to assess early pregnancy.

[Series 1: us ob less than 14 weeks with ob transvaginal · 13 of 35 slices shown]
[im 2/35]
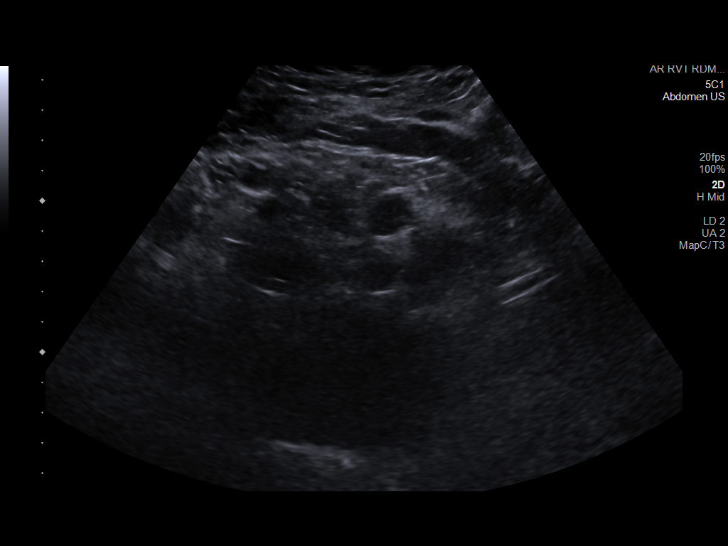
[im 4/35]
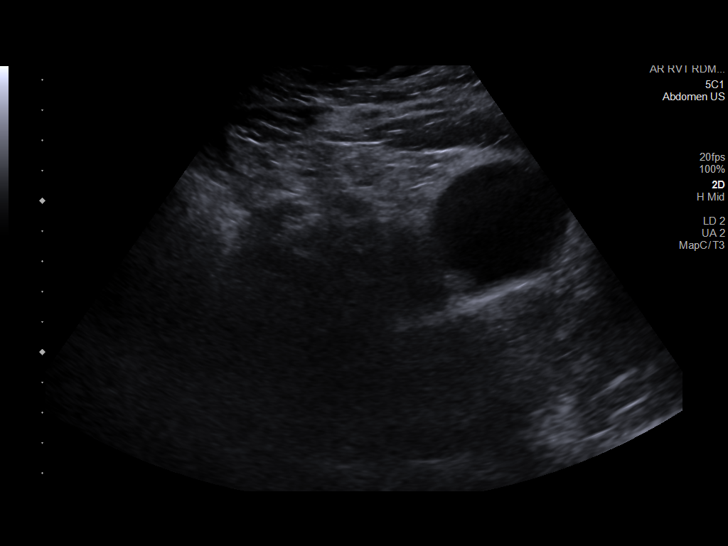
[im 7/35]
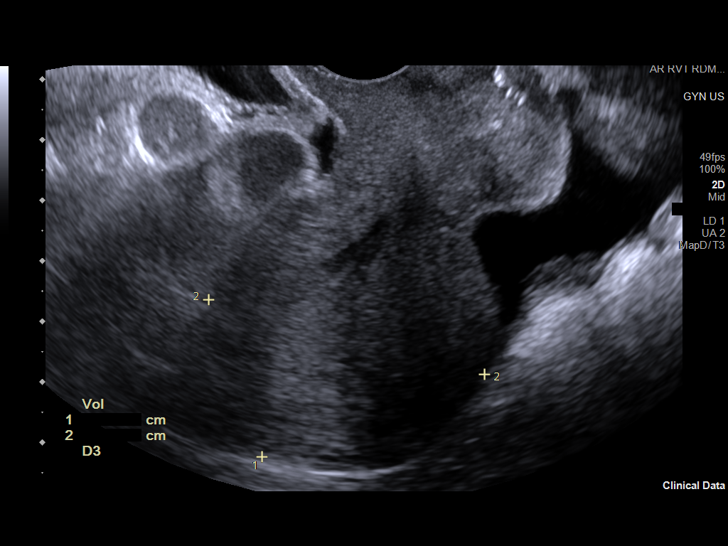
[im 9/35]
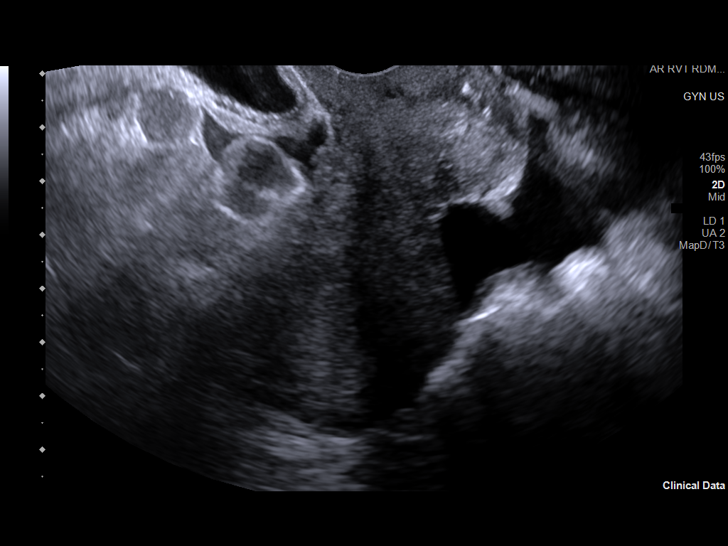
[im 12/35]
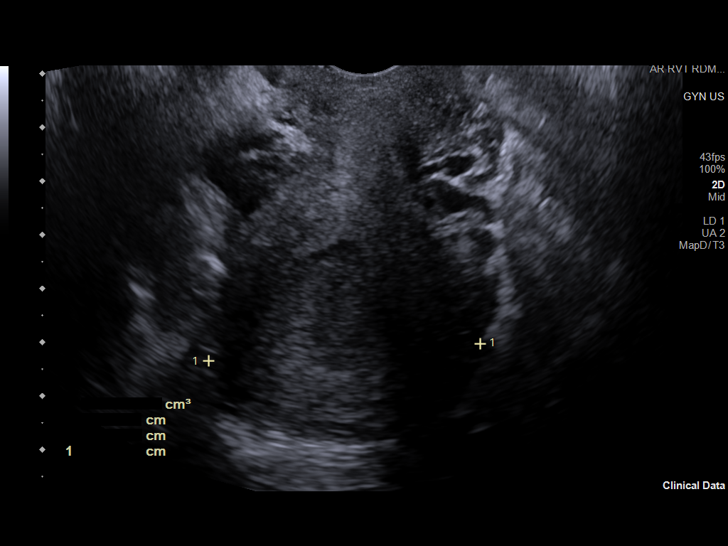
[im 14/35]
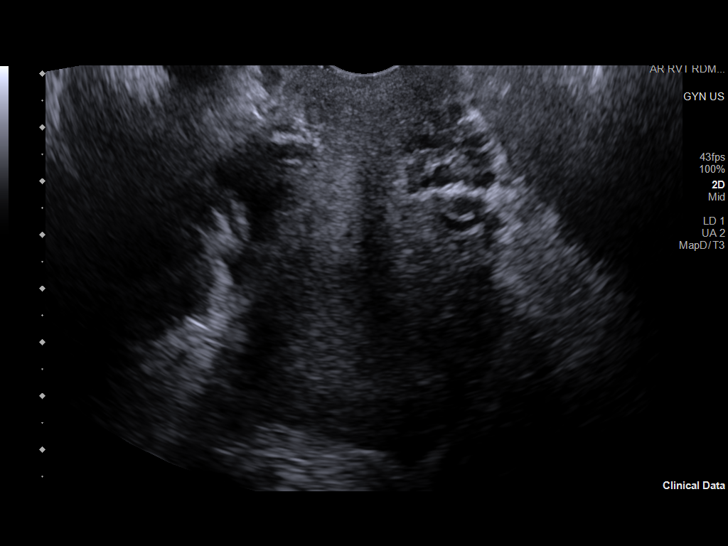
[im 18/35]
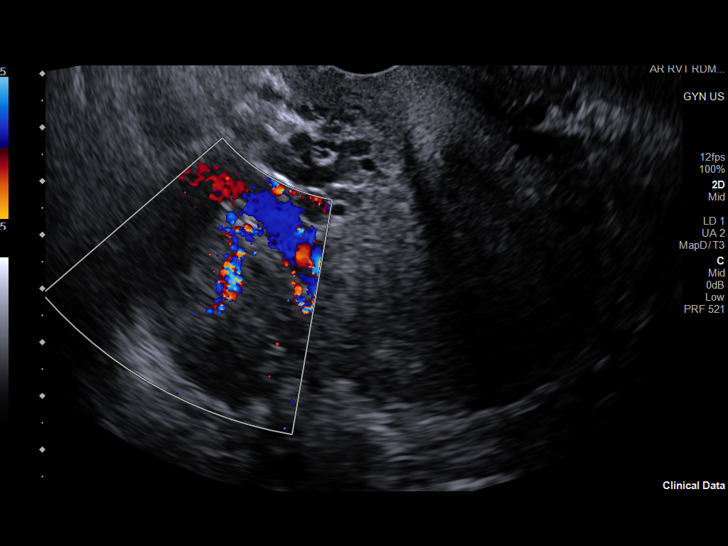
[im 21/35]
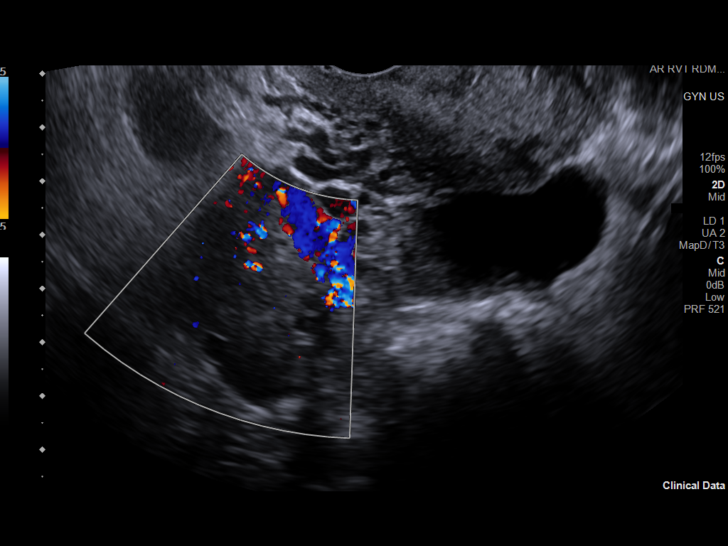
[im 23/35]
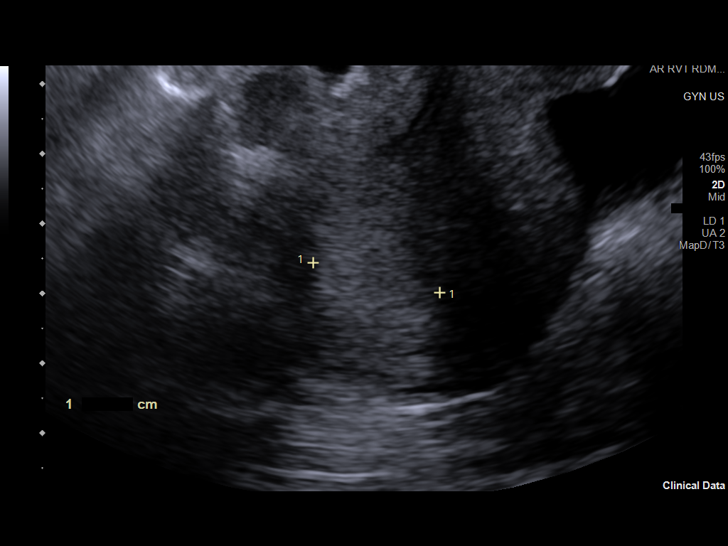
[im 26/35]
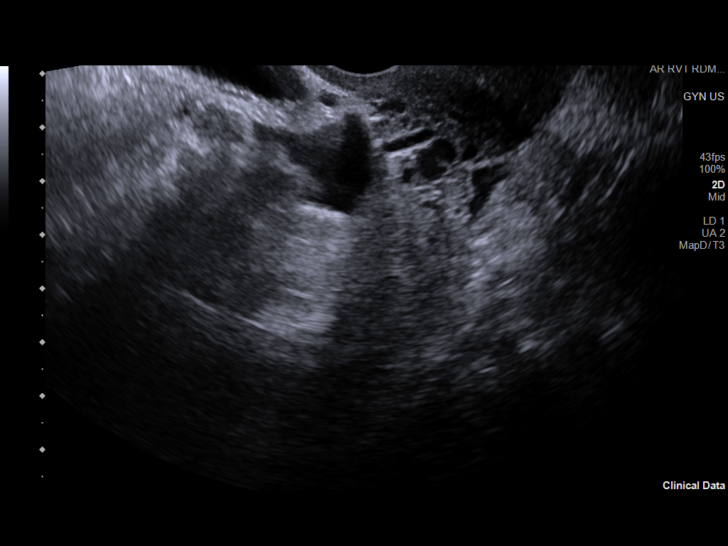
[im 28/35]
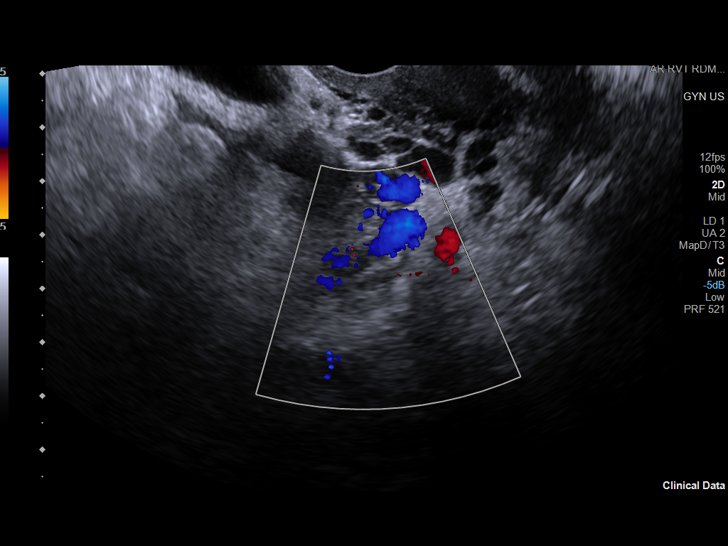
[im 31/35]
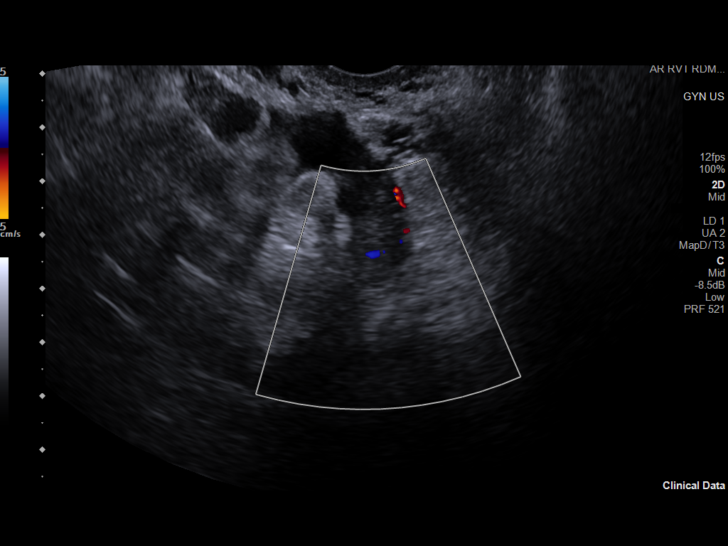
[im 33/35]
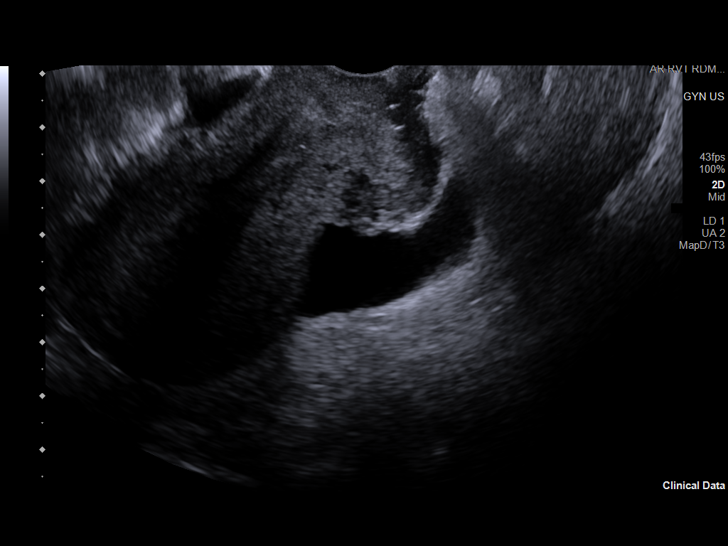

[13 of 28 positions shown; findings below may reference images not displayed]

FINDINGS: Intrauterine gestational sac: None

Yolk sac:  Not Visualized.

Embryo:  Not Visualized.

Cardiac Activity: Not Visualized.

Heart Rate:   bpm

MSD:   mm    w     d

CRL:    mm    w    d                  US EDC:

Subchorionic hemorrhage:  None visualized.

Maternal uterus/adnexae: Heterogeneous echotexture throughout the
uterus. Endometrium approximately 18 mm in thickness, difficult to
visualize due to the heterogeneous echotexture throughout the
uterus. No adnexal mass. Moderate free fluid in the pelvis.
IMPRESSION: No intrauterine pregnancy visualized. Differential considerations
would include early intrauterine pregnancy too early to visualize,
spontaneous abortion, or occult ectopic pregnancy. Recommend close
clinical followup and serial quantitative beta HCGs and ultrasounds.

Moderate free fluid.
